# Patient Record
Sex: Male | Born: 1980 | Hispanic: Yes | Marital: Single | Smoking: Heavy tobacco smoker
Health system: Southern US, Community
[De-identification: ages and names within clinical notes are randomized; demographics above are authoritative.]

## PROBLEM LIST (undated history)

## (undated) DIAGNOSIS — B20 Human immunodeficiency virus [HIV] disease: Secondary | ICD-10-CM

## (undated) DIAGNOSIS — R4189 Other symptoms and signs involving cognitive functions and awareness: Secondary | ICD-10-CM

---

## 2016-12-29 ENCOUNTER — Emergency Department
Admission: EM | Admit: 2016-12-29 | Discharge: 2016-12-29 | Disposition: A | Discharge status: Home / Self Care | Payer: Medicare Other | Attending: Emergency Medicine | Admitting: Emergency Medicine

## 2016-12-29 DIAGNOSIS — F17200 Nicotine dependence, unspecified, uncomplicated: Secondary | ICD-10-CM | POA: Diagnosis not present

## 2016-12-29 DIAGNOSIS — Z139 Encounter for screening, unspecified: Secondary | ICD-10-CM

## 2016-12-29 DIAGNOSIS — Z046 Encounter for general psychiatric examination, requested by authority: Secondary | ICD-10-CM | POA: Diagnosis present

## 2016-12-29 LAB — URINE DRUG SCREEN, QUALITATIVE (ARMC ONLY)
Amphetamines, Ur Screen: NOT DETECTED
BARBITURATES, UR SCREEN: NOT DETECTED
Benzodiazepine, Ur Scrn: NOT DETECTED
CANNABINOID 50 NG, UR ~~LOC~~: NOT DETECTED
COCAINE METABOLITE, UR ~~LOC~~: NOT DETECTED
MDMA (ECSTASY) UR SCREEN: NOT DETECTED
Methadone Scn, Ur: NOT DETECTED
OPIATE, UR SCREEN: NOT DETECTED
PHENCYCLIDINE (PCP) UR S: NOT DETECTED
TRICYCLIC, UR SCREEN: NOT DETECTED

## 2016-12-29 LAB — COMPREHENSIVE METABOLIC PANEL
ALK PHOS: 124 U/L (ref 38–126)
ALT: 11 U/L — AB (ref 17–63)
AST: 23 U/L (ref 15–41)
Albumin: 4.3 g/dL (ref 3.5–5.0)
Anion gap: 9 (ref 5–15)
BUN: 21 mg/dL — AB (ref 6–20)
CALCIUM: 9.6 mg/dL (ref 8.9–10.3)
CHLORIDE: 105 mmol/L (ref 101–111)
CO2: 27 mmol/L (ref 22–32)
CREATININE: 1.24 mg/dL (ref 0.61–1.24)
GFR calc non Af Amer: >60 mL/min (ref >60)
GLUCOSE: 80 mg/dL (ref 65–99)
Potassium: 4.1 mmol/L (ref 3.5–5.1)
SODIUM: 141 mmol/L (ref 135–145)
Total Bilirubin: 0.7 mg/dL (ref 0.3–1.2)
Total Protein: 8.5 g/dL — ABNORMAL HIGH (ref 6.5–8.1)

## 2016-12-29 LAB — CBC
HEMATOCRIT: 43.8 % (ref 40.0–52.0)
HEMOGLOBIN: 15.2 g/dL (ref 13.0–18.0)
MCH: 33.5 pg (ref 26.0–34.0)
MCHC: 34.6 g/dL (ref 32.0–36.0)
MCV: 96.8 fL (ref 80.0–100.0)
Platelets: 319 10*3/uL (ref 150–440)
RBC: 4.53 MIL/uL (ref 4.40–5.90)
RDW: 14.9 % — ABNORMAL HIGH (ref 11.5–14.5)
WBC: 8.1 10*3/uL (ref 3.8–10.6)

## 2016-12-29 LAB — ETHANOL: Alcohol, Ethyl (B): <10 mg/dL (ref <10)

## 2016-12-29 LAB — SALICYLATE LEVEL: Salicylate Lvl: <7 mg/dL (ref 2.8–30.0)

## 2016-12-29 LAB — ACETAMINOPHEN LEVEL: Acetaminophen (Tylenol), Serum: <10 ug/mL — ABNORMAL LOW (ref 10–30)

## 2016-12-29 NOTE — ED Notes (Addendum)
First nurse note: pt [place] by [place] [employer] department. Pt is currently voluntary. Per sheriff pt was found on [address]. Pt reports that he lives in a group home but does not know that name of his group home. Pt has been calm and cooperative.

## 2016-12-29 NOTE — ED Notes (Signed)
This RN called Group home at [phone] and requested them to come pick up the patient. Group home person Ms. Ray had told the CSW around 4 pm this afternoon she would be here in 2 hours and she has not picked up the patient at this time. Staff person states she will find out what is going on and call me back.

## 2016-12-29 NOTE — ED Notes (Signed)
This RN spoke with Brock BadLuwanda Ray from [facility] who requested pt be returned to the facility by taxi that they would pay for. This was confirmed by Rolinda RoanKendall M. RN also. Pt will be transported via taxi to [address] [place], [id number]. I informed Ms. Ray of the patients discharge instructions and that I would send his paperwork with the taxi driver. Ms Rosalia HammersRay was agreeable with all of this.

## 2016-12-29 NOTE — ED Notes (Signed)

## 2016-12-29 NOTE — ED Notes (Signed)
Adult protective services worker [name] ph [phone] per state trooper.

## 2016-12-29 NOTE — Progress Notes (Signed)
LCSW received consult to engage with patient to collect information by EDP.   LCSW completed breif assessment and called APS on call worker [name] and together follow up calls will be made to community resources to support the patient. Patient was able to recite his SSN [id number] info provided to DSS APS worker and we will split the calls to obtain further information  Patient according to [name] at the Treatment Center reports patient was to go to a facility in [place]. LCSW will call [facility] to see if they have information [phone] unable to reach staff, will try again  Patient reports he was in  [employer] [place] and reports he did graduate ( not sure) Patient reports he has a guardian names [name]- but has no last name or address or phone number. He reports he was at [place] treatment facility [place] and checked out on Friday  [phone]  Assessment completed.

## 2016-12-29 NOTE — ED Notes (Signed)
Pt dressed out in triage into paper scrubs.   Items collected: Black shirt ComptrollerGrey thermal shirt Grey jacket Grey pants Nike shoes Pair  socks

## 2016-12-29 NOTE — Discharge Instructions (Signed)
°  Follow up with your doctor and/or therapist as soon as possible regarding today's ED visit.   Please follow up any other recommendations and clinic appointments provided by the psychiatry team that saw you in the Emergency Department.

## 2016-12-29 NOTE — ED Notes (Signed)
BEHAVIORAL HEALTH ROUNDING  Patient sleeping: No.  Patient alert and oriented: yes  Behavior appropriate: Yes. ; If no, describe:  Nutrition and fluids offered: Yes  Toileting and hygiene offered: Yes  Sitter present: not applicable, Q 15 min safety rounds and observation.  Law enforcement present: Yes ODS  

## 2016-12-29 NOTE — ED Notes (Signed)
Report received - seen by sw Claudine. Pt sleeping in room

## 2016-12-29 NOTE — ED Notes (Signed)
Pt given lunch tray.

## 2016-12-29 NOTE — Clinical Social Work Note (Signed)
Clinical Social Work Assessment  Patient Details  Name: Brandon Berger MRN: [id number] Date of Birth: Jan 22, 1981  Date of referral:  12/29/16               Reason for consult:  Facility Placement, Guardianship Needs                Permission sought to share information with:  Facility Medical sales representativeContact Representative, Family Supports Permission granted to share information::  Yes, Verbal Permission Granted  Name::        Agency::  [facility] [place] [phone]  Relationship::     Contact Information:     Housing/Transportation Living arrangements for the past 2 months:  No permanent address Source of Information:  Patient, Other (Comment Required), Law Enforcement(Brandon Berger -APS [place] worker [phone] after hours.) Patient Interpreter Needed:    Criminal Activity/Legal Involvement Pertinent to Current Situation/Hospitalization:    Significant Relationships:  None Lives with:  Other (Comment)(TBD) Do you feel safe going back to the place where you live?    Need for family participation in patient care:  Yes (Comment)  Care giving concerns:  TBD   Social Worker assessment / plan:  LCSW received consult to engage with patient to collect information by EDP.   Introduced self to patient and he stated I could call any one I wanted. His Moms name is Brandon FavaShirley Berger in [place] and he remembers a guardian called Brandon DroneBrenda but not her last name or number. Patient has a mild speech impediment or he has marbled effect when speaking. Patient oriented x2- short attention span.   LCSW completed breif assessment and called APS on call worker Brandon Derrythan and together follow up calls will be made to community resources to support the patient. Patient was able to recite his SSN [id number] info provided to DSS APS worker and we will split the calls to obtain further information  Patient according to Brandon RalphsVivian at the Treatment Center reports patient was to go to a facility in [place]. LCSW will  call [facility] to see if they have information [phone] unable to reach staff, will try again  Patient reports he was in  [employer]ial [employer] [place] and reports he did graduate ( not sure) Patient reports he has a guardian names Brandon DroneBrenda- but has no last name or address or phone number. He reports he was [place] and checked out on Friday  [phone]  Assessment completed. LCSW and APS worker will work to find his group home/facility             Employment status:  Disabled (Comment on whether or not currently receiving Disability) Insurance information:  [place]:    Information / Referral to community resources:  APS (Comment Required: [place], Name & Number of worker spoken with)([name] )  Patient/Family's Response to care: TBD  Patient/Family's Understanding of and Emotional Response to Diagnosis, Current Treatment, and Prognosis:   He understands he is in hospital and is lost right now  Emotional Assessment Appearance:  Appears stated age Attitude/Demeanor/Rapport:  Inconsistent Affect (typically observed):  Calm, Adaptable Orientation:  Oriented to Self Alcohol / Substance use:  Not Applicable Psych involvement (Current and /or in the community):  Yes (Comment)  Discharge Needs  Concerns to be addressed:  Basic Needs Readmission within the last 30 days:  No Current discharge risk:  Cognitively Impaired Barriers to Discharge:  Barriers Resolved   Brandon Berger  M, LCSW 12/29/2016, 2:49 PM

## 2016-12-29 NOTE — ED Notes (Signed)
[place] [employer] taxi call for transport. Will be here in approx 25 mins.

## 2016-12-29 NOTE — ED Notes (Signed)
MD at bedside. 

## 2016-12-29 NOTE — ED Triage Notes (Signed)
Transported here by [employer] trooper Anselmo RodJ Sharpe [phone]. Pt has legal guardian per pt report. Unknown number/name. Pt reports left group home this am. Recent surgery per pt due to ruptured appendix. Pt presents via officer for suspicious activity, Unable to locate group home or guardian at this time. Pt A&O and ambulatory.

## 2016-12-29 NOTE — ED Provider Notes (Signed)
[facility] Emergency Department Provider Note   ____________________________________________   First MD Initiated Contact with Patient 12/29/16 1301     (approximate)  I have reviewed the triage vital signs and the nursing notes.   HISTORY  Chief Complaint Psychiatric Evaluation    HPI Brandon Berger is a 36 y.o. male brought for evaluation as he was found walking in the road and did not have an idea as to where he is to go.  Patient was evaluated by law enforcement who have begin a search for patients contacts, but the patient reports that he had surgery on his appendix which had ruptured and then he was admitted to a rehabilitation and he left there after several days.  He knows that his mom lives in [place], he gives an address for what he believes was a group home where he used to live, but then walked out from rehabilitation and scissors states he started walking and was picked out but here after having [facility].  Reports a guardian named '[name]' but doesn't know how to contact her.  The history is somewhat unclear, but he reports being in no pain.  Reports he is hungry.  Denies abdominal pain.  Reports that he is on medications which he takes regularly.  States sugars evidently contacted [facility] [employer], currently searching for his care provider or family  History reviewed. No pertinent past medical history.  There are no active problems to display for this patient.   History reviewed. No pertinent surgical history.  Prior to Admission medications   Not on File    Allergies Patient has no known allergies.  History reviewed. No pertinent family history.  Social History Social History   Tobacco Use  . Smoking status: Heavy Tobacco Smoker  . Smokeless tobacco: Never Used  Substance Use Topics  . Alcohol use: No    Frequency: Never  . Drug use: Yes    Types: Marijuana    Review of  Systems Constitutional: No fever/chills ENT: No sore throat. Cardiovascular: Denies chest pain. Respiratory: Denies shortness of breath. Gastrointestinal: No abdominal pain.  No nausea, no vomiting.  No diarrhea.  No constipation. Musculoskeletal: Negative for back pain. Skin: Negative for rash. Neurological: Negative for headaches, focal weakness or numbness.    ____________________________________________   PHYSICAL EXAM:  VITAL SIGNS: ED Triage Vitals  Enc Vitals Group     BP 12/29/16 1222 115/70     Pulse --      Resp 12/29/16 1219 (P) 14     Temp 12/29/16 1222 98 F (36.7 C)     Temp Source 12/29/16 1222 Oral     SpO2 12/29/16 1219 (P) 97 %     Weight 12/29/16 1223 150 lb (68 kg)     Height 12/29/16 1223 5\' 8"  (1.727 m)     Head Circumference --      Peak Flow --      Pain Score --      Pain Loc --      Pain Edu? --      Excl. in GC? --     Constitutional: Alert and oriented. Well appearing and in no acute distress. Eyes: Conjunctivae are normal. Head: Atraumatic. Nose: No congestion/rhinnorhea. Mouth/Throat: Mucous membranes are moist. Neck: No stridor.   Cardiovascular: Normal rate, regular rhythm. Grossly normal heart sounds.  Good peripheral circulation. Respiratory: Normal respiratory effort.  No retractions. Lungs CTAB. Gastrointestinal: Soft and nontender. No distention. Musculoskeletal:  No lower extremity tenderness nor edema. Neurologic:  Normal speech and language. No gross focal neurologic deficits are appreciated.  Skin:  Skin is warm, dry and intact. No rash noted. Psychiatric: Mood and affect are normal. Speech and behavior are normal.  ____________________________________________   LABS (all labs ordered are listed, but only abnormal results are displayed)  Labs Reviewed  COMPREHENSIVE METABOLIC PANEL - Abnormal; Notable for the following components:      Result Value   BUN 21 (*)    Total Protein 8.5 (*)    ALT 11 (*)    All other  components within normal limits  ACETAMINOPHEN LEVEL - Abnormal; Notable for the following components:   Acetaminophen (Tylenol), Serum <10 (*)    All other components within normal limits  CBC - Abnormal; Notable for the following components:   RDW 14.9 (*)    All other components within normal limits  ETHANOL  SALICYLATE LEVEL  URINE DRUG SCREEN, QUALITATIVE ([facility] ONLY)   ____________________________________________  EKG   ____________________________________________  RADIOLOGY    ____________________________________________   PROCEDURES  Procedure(s) performed: None  Procedures  Critical Care performed: No  ____________________________________________   INITIAL IMPRESSION / ASSESSMENT AND PLAN / ED COURSE  Pertinent labs & imaging results that were available during my care of the patient were reviewed by me and considered in my medical decision making (see chart for details).  ----------------------------------------- 4:20 PM on 12/29/2016 -----------------------------------------  Patient's group home has been located.  He will be coming to pick him up.  They report that they were waiting for him to return from a walk.  He is in no distress, and will return to his caretakers at his group home once they arrived to pick him up.      ____________________________________________   FINAL CLINICAL IMPRESSION(S) / ED DIAGNOSES  Final diagnoses:  Encounter for medical screening examination      NEW MEDICATIONS STARTED DURING THIS VISIT:  This SmartLink is deprecated. Use AVSMEDLIST instead to display the medication list for a patient.   Note:  This document was prepared using Dragon voice recognition software and may include unintentional dictation errors.     Sharyn CreamerQuale, Talha Iser, MD 12/29/16 1620

## 2016-12-29 NOTE — Progress Notes (Signed)
Called Brandon Berger [phone] and he provided information according to treatment center nurse patient is HIV positive  Brandon Berger  Resides at [facility] [phone] spoke to Brock BadLuwanda Ray  Group home will be by in the next 2 hours to pick up patient  [employer]audine Carmelite Violet LCSW [phone]

## 2016-12-30 NOTE — ED Notes (Signed)
This RN attempted to call patients legal guardian Verl BlalockBrenda Sinner at [phone] to inform her that the patient has been in the ED and is being discharge back to the group home. No answer at this number. Guardian information was given to this RN by Brock BadLuwanda Ray, owner of the group home.

## 2016-12-31 ENCOUNTER — Emergency Department (HOSPITAL_COMMUNITY): Payer: Medicare Other

## 2016-12-31 ENCOUNTER — Encounter (HOSPITAL_COMMUNITY): Payer: Self-pay | Admitting: *Deleted

## 2016-12-31 ENCOUNTER — Emergency Department (HOSPITAL_COMMUNITY)
Admission: EM | Admit: 2016-12-31 | Discharge: 2017-01-01 | Disposition: A | Discharge status: Home / Self Care | Payer: Medicare Other | Attending: Emergency Medicine | Admitting: Emergency Medicine

## 2016-12-31 ENCOUNTER — Other Ambulatory Visit: Payer: Self-pay

## 2016-12-31 DIAGNOSIS — Z79899 Other long term (current) drug therapy: Secondary | ICD-10-CM | POA: Insufficient documentation

## 2016-12-31 DIAGNOSIS — Y999 Unspecified external cause status: Secondary | ICD-10-CM | POA: Diagnosis not present

## 2016-12-31 DIAGNOSIS — F17210 Nicotine dependence, cigarettes, uncomplicated: Secondary | ICD-10-CM | POA: Diagnosis not present

## 2016-12-31 DIAGNOSIS — Y939 Activity, unspecified: Secondary | ICD-10-CM | POA: Diagnosis not present

## 2016-12-31 DIAGNOSIS — Z21 Asymptomatic human immunodeficiency virus [HIV] infection status: Secondary | ICD-10-CM | POA: Insufficient documentation

## 2016-12-31 DIAGNOSIS — W19XXXA Unspecified fall, initial encounter: Secondary | ICD-10-CM | POA: Diagnosis not present

## 2016-12-31 DIAGNOSIS — S20309A Unspecified superficial injuries of unspecified front wall of thorax, initial encounter: Secondary | ICD-10-CM | POA: Diagnosis present

## 2016-12-31 DIAGNOSIS — Y929 Unspecified place or not applicable: Secondary | ICD-10-CM | POA: Diagnosis not present

## 2016-12-31 DIAGNOSIS — S2231XA Fracture of one rib, right side, initial encounter for closed fracture: Secondary | ICD-10-CM | POA: Insufficient documentation

## 2016-12-31 HISTORY — DX: Other symptoms and signs involving cognitive functions and awareness: R41.89

## 2016-12-31 HISTORY — DX: Human immunodeficiency virus (HIV) disease: B20

## 2016-12-31 NOTE — ED Notes (Signed)
Patient transported to X-ray 

## 2016-12-31 NOTE — ED Triage Notes (Signed)
Pt brought in by [facility] for c/o right side abdominal pain and right leg pain

## 2017-01-01 MED ORDER — CYCLOBENZAPRINE HCL 5 MG PO TABS: 5.0000 mg | ORAL_TABLET | Freq: Three times a day (TID) | ORAL | 0 refills | Status: AC | PRN

## 2017-01-01 MED ORDER — NAPROXEN 250 MG PO TABS: ORAL_TABLET | ORAL | 0 refills | Status: AC

## 2017-01-01 NOTE — ED Notes (Signed)
Pt given another coke per request.

## 2017-01-01 NOTE — ED Notes (Signed)
Pt ambulatory w/o difficulties.

## 2017-01-01 NOTE — ED Notes (Signed)
Called facility back and staff report they called for transport but no one answered, message was left. Requested that they call back for transport as pt is still a/w ride home. Staff verbalized they would call again.

## 2017-01-01 NOTE — ED Notes (Signed)
Pt ambulatory to bathroom and back to room 

## 2017-01-01 NOTE — Discharge Instructions (Signed)
Use ice for comfort. Give him the medications as needed for pain. It appears he is already on oxycodone for pain.  He should be rechecked if he gets a fever, cough, struggles to breathe. He should be rechecked by his doctor in about 2 weeks.

## 2017-01-01 NOTE — ED Notes (Signed)
01/01/17- [id number]:  The group home [facility] ([address] [place], [place] [address]) [phone] has been contacted multiple times by ED personell this morning to get a ride for the patient home and no one has arrived yet and the staff at the group home keeps telling the ED staff that someone is coming to get the patient. Adult Protective Services of [name] was contacted and a report was filed for patient abandonment and Lyla SonCarrie at [facility] is to follow up with ED.  0915Sidney Ace: [place] PD contacted bc patient has wandered away from the waiting room toward the front of the hospital.  0920: Onalee HuaDavid [phone] is a Haematologiststaff member of [facility] showed up at this time and was informed patient had left the ED. [phone]: [place] PD located the patient at this time and pt left with Onalee Huaavid (group home staff member).

## 2017-01-01 NOTE — ED Notes (Signed)
Pt get restless and states he is ready to go home. Pt given gingerale and snack and redirected to room.

## 2017-01-01 NOTE — ED Notes (Signed)
ED Provider at bedside. 

## 2017-01-01 NOTE — ED Provider Notes (Addendum)
[facility] EMERGENCY DEPARTMENT Provider Note   CSN: [id number] Arrival date & time: 12/31/16  2246  Time seen 12:18 AM   History   Chief Complaint Chief Complaint  Patient presents with  . Chest Pain    right lower rib pain from fall   Level 5 caveat for mental retardation  HPI Brandon Berger is a 36 y.o. male.  HPI patient speech is very hard to understand, he is mumbling and speaking softly.  He states he fell "yesterday" and that may have been 2 days ago.  He states he was walking up a hill and he fell forward.  He complains of some pain of his right lower rib cage since he fell.  He denies feeling short of breath or cough.  Patient states EMS picked him up from his group home, he does not know the name of where he stays.  He states he is only been there about 3 days.  He states before that he lived 5 months somewhere else in [place].  PCP unknown to the patient  Past Medical History:  Diagnosis Date  . Cognitive deficits   . Cognitive impairment   . HIV (human immunodeficiency virus infection) (HCC)     There are no active problems to display for this patient.   History reviewed. No pertinent surgical history.     Home Medications    Prior to Admission medications   Medication Sig Start Date End Date Taking? Authorizing Provider  atomoxetine (STRATTERA) 40 MG capsule Take 40 mg by mouth daily.   Yes [provider]  Darunavir-Cobicisctat-Emtricitabine-Tenofovir Alafenamide (SYMTUZA) 800-150-200-10 MG TABS Take 1 tablet by mouth daily.   Yes [provider]  docusate sodium (COLACE) 100 MG capsule Take 100 mg by mouth at bedtime.   Yes [provider]  ENSURE (ENSURE) Take 237 mLs by mouth 3 (three) times daily between meals.   Yes [provider]  famotidine (PEPCID) 20 MG tablet Take 20 mg by mouth 2 (two) times daily.   Yes [provider]  gabapentin (NEURONTIN) 400 MG capsule Take 400 mg by mouth 3 (three)  times daily.   Yes [provider]  lamoTRIgine (LAMICTAL) 200 MG tablet Take 200 mg by mouth every morning.   Yes [provider]  OLANZapine (ZYPREXA) 10 MG tablet Take 10 mg by mouth at bedtime.   Yes [provider]  OLANZapine (ZYPREXA) 15 MG tablet Take 15 mg by mouth at bedtime.   Yes [provider]  oxyCODONE-acetaminophen (PERCOCET/ROXICET) 5-325 MG tablet Take 1 tablet by mouth every 6 (six) hours as needed for moderate pain or severe pain.   Yes [provider]  cyclobenzaprine (FLEXERIL) 5 MG tablet Take 1 tablet (5 mg total) by mouth 3 (three) times daily as needed. 01/01/17   Devoria Albe, MD  naproxen (NAPROSYN) 250 MG tablet Take 1 po BID with food prn pain 01/01/17   Devoria Albe, MD    Family History History reviewed. No pertinent family history.  Social History Social History   Tobacco Use  . Smoking status: Heavy Tobacco Smoker  . Smokeless tobacco: Never Used  Substance Use Topics  . Alcohol use: No    Frequency: Never  . Drug use: Yes    Types: Marijuana  lives in a group home   Allergies   Patient has no known allergies.   Review of Systems Review of Systems  Unable to perform ROS: Other     Physical Exam Updated  Vital Signs BP 99/70   Pulse 68   Temp 97.6 F (36.4 C) (Oral)   Resp 17   Ht 5\' 8"  (1.727 m)   Wt 68 kg (150 lb)   SpO2 97%   BMI 22.81 kg/m   Physical Exam  Constitutional: He appears well-developed and well-nourished. He does not appear ill.  Pt mumbles and speaks softly  HENT:  Head: Normocephalic and atraumatic.  Missing several teeth  Eyes: EOM are normal. Pupils are equal, round, and reactive to light.  Neck: Normal range of motion. Neck supple.  Cardiovascular: Normal rate and regular rhythm. Exam reveals no S3 and no S4.  No murmur heard. Pulmonary/Chest: Effort normal. He has decreased breath sounds.  Musculoskeletal: Normal range of motion.       Right lower leg: Normal.        Left lower leg: Normal.  Neurological: He is alert.  Skin: Skin is warm and dry.  Has an area on his right flank with some hyperpigmentation that he states is from shingles in the past  Psychiatric: He has a normal mood and affect. His behavior is normal.  Nursing note and vitals reviewed.    ED Treatments / Results  Labs (all labs ordered are listed, but only abnormal results are displayed) Labs Reviewed - No data to display  BMET    Component Value Date/Time   NA 141 12/29/2016 1228   K 4.1 12/29/2016 1228   CL 105 12/29/2016 1228   CO2 27 12/29/2016 1228   GLUCOSE 80 12/29/2016 1228   BUN 21 (H) 12/29/2016 1228   CREATININE 1.24 12/29/2016 1228   CALCIUM 9.6 12/29/2016 1228   GFRNONAA >60 12/29/2016 1228   GFRAA >60 12/29/2016 1228      EKG  EKG Interpretation None       Radiology Dg Ribs Unilateral W/chest Right  Result Date: 01/01/2017 CLINICAL DATA:  Right rib pain. EXAM: RIGHT RIBS AND CHEST - 3+ VIEW COMPARISON:  None. FINDINGS: Minimally displaced fracture of the right lateral eighth rib. There is no evidence of pneumothorax or pleural effusion. Both lungs are clear. Heart size and mediastinal contours are within normal limits. IMPRESSION: Minimally displaced fracture of the right lateral eighth rib. Electronically Signed   By: Ted Mcalpineobrinka  Dimitrova M.D.   On: 01/01/2017 01:19    Procedures Procedures (including critical care time)  Medications Ordered in ED Medications - No data to display   Initial Impression / Assessment and Plan / ED Course  I have reviewed the triage vital signs and the nursing notes.  Pertinent labs & imaging results that were available during my care of the patient were reviewed by me and considered in my medical decision making (see chart for details).     X-ray was done to look for rib fracture.  Nurses note patient was ambulatory from the ambulance into the ED without assistance.  When I asked the patient he denies  having any leg pain.  Patient was awakened at time of discharge and told he had a rib fracture.  Nursing staff has contacted his group home to send him back.  Patient already has oxycodone on his medication list.  He was discharged home with naproxen and Flexeril.  His BUN and creatinine were noted from 2 days ago.  Review of the [place] database shows patient has had lorazepam 0.5 mg tablets written for 7 up to 42 tablets several times from April to May 31.  He got #30 oxycodone 5/325 on  July 20, #120 oxycodone 5/325 on August 20, and #40 oxycodone 5/325 on August 20.  It is not clear if these are this patient because they are from physicians in [place], [place], [place], and [place].   Final Clinical Impressions(s) / ED Diagnoses   Final diagnoses:  Closed fracture of one rib of right side, initial encounter    ED Discharge Orders        Ordered    naproxen (NAPROSYN) 250 MG tablet     01/01/17 0316    cyclobenzaprine (FLEXERIL) 5 MG tablet  3 times daily PRN     01/01/17 0316      Plan discharge  Devoria AlbeIva Averi Kilty, MD, Concha PyoFACEP    Laderius Valbuena, MD 01/01/17 [id number]    Devoria AlbeKnapp, Recardo Linn, MD 01/01/17 [phone]

## 2017-01-01 NOTE — ED Notes (Signed)
This nurse Called back to group home and spoke with Brandon Berger, she reports that transportation is on way and it will either be Brandon Berger or Brandon Berger to pick up pt. Discharge instructions were reviewed with Brandon Berger including Rx and pain management and follow up. She verbalized understanding. Pt given discharge papers with Rx and is waiting in lobby. Informed Brandon Berger that pt would be waiting for ride in lobby.

## 2017-01-01 NOTE — ED Notes (Signed)
Pt given coke to drink 

## 2017-01-01 NOTE — ED Notes (Signed)
[facility] called to inform pt is being discharged- informed staff pt will need ride home. Staff reports they will send transportation to [facility].

## 2017-01-01 NOTE — ED Notes (Addendum)
Called facility for the third time, spoke with Georgeanna HarrisonSearra, who reports the transport staff usually do not answer or return calls until 6 am. Requested that she call me back when she got in touch with transport to confirm. Sea verbalized she would call me back.

## 2017-01-26 ENCOUNTER — Emergency Department (HOSPITAL_COMMUNITY)
Admission: EM | Admit: 2017-01-26 | Discharge: 2017-01-26 | Disposition: A | Discharge status: Home / Self Care | Payer: Medicare Other | Attending: Emergency Medicine | Admitting: Emergency Medicine

## 2017-01-26 ENCOUNTER — Emergency Department (HOSPITAL_COMMUNITY): Payer: Medicare Other

## 2017-01-26 ENCOUNTER — Encounter (HOSPITAL_COMMUNITY): Payer: Self-pay | Admitting: Emergency Medicine

## 2017-01-26 DIAGNOSIS — S2231XD Fracture of one rib, right side, subsequent encounter for fracture with routine healing: Secondary | ICD-10-CM | POA: Diagnosis not present

## 2017-01-26 DIAGNOSIS — R0789 Other chest pain: Secondary | ICD-10-CM | POA: Diagnosis present

## 2017-01-26 DIAGNOSIS — F17200 Nicotine dependence, unspecified, uncomplicated: Secondary | ICD-10-CM | POA: Insufficient documentation

## 2017-01-26 DIAGNOSIS — Z79899 Other long term (current) drug therapy: Secondary | ICD-10-CM | POA: Insufficient documentation

## 2017-01-26 DIAGNOSIS — X58XXXD Exposure to other specified factors, subsequent encounter: Secondary | ICD-10-CM | POA: Diagnosis not present

## 2017-01-26 DIAGNOSIS — S2231XG Fracture of one rib, right side, subsequent encounter for fracture with delayed healing: Secondary | ICD-10-CM

## 2017-01-26 NOTE — ED Notes (Signed)
Pt has been resting quietly since arrival  He is difficult to understand  Call to care home POC: Alger MemosLinda Berger who advises that pt needs to be returned by EMS  She asks' has he tried to run away on you?"

## 2017-01-26 NOTE — ED Notes (Signed)
[facility] [phone] To advise that pt needs to be returned to care home by stretcher
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
## 2017-01-26 NOTE — ED Notes (Signed)
Sleeping NAD

## 2017-01-26 NOTE — ED Notes (Signed)
Meal provided  Pt assisted to BR  Pt has an altered gait and is DDelayed  Difficult to redirect

## 2017-01-26 NOTE — ED Provider Notes (Signed)
[facility]Y [facility]PART[facility] Provider Note   CSN: [id number] Arrival date & time: 01/26/17  1043     History   Chief Complaint Chief Complaint  Patient presents with  . Chest Pain    HPI Brandon Berger is a 36 y.o. male.  Level 5 caveat for cognitive impairment.  Patient complains of right lateral rib pain.  He has a recent known fracture of his right eighth rib.  No substernal chest pain, dyspnea, fever, sweats, chills.  Severity of pain is mild to moderate.  Palpation makes pain worse.      Past Medical History:  Diagnosis Date  . Cognitive deficits   . Cognitive impairment   . HIV (human immunodeficiency virus infection) (HCC)     There are no active problems to display for this patient.   History reviewed. No pertinent surgical history.     Home Medications    Prior to Admission medications   Medication Sig Start Date End Date Taking? Authorizing Provider  atomoxetine (STRATTERA) 40 MG capsule Take 40 mg by mouth daily.    [provider]  cyclobenzaprine (FLEXERIL) 5 MG tablet Take 1 tablet (5 mg total) by mouth 3 (three) times daily as needed. 01/01/17   Devoria AlbeKnapp, Iva, MD  Darunavir-Cobicisctat-Emtricitabine-Tenofovir Alafenamide (SYMTUZA) 800-150-200-10 MG TABS Take 1 tablet by mouth daily.    [provider]  docusate sodium (COLACE) 100 MG capsule Take 100 mg by mouth at bedtime.    [provider]  ENSURE (ENSURE) Take 237 mLs by mouth 3 (three) times daily between meals.    [provider]  famotidine (PEPCID) 20 MG tablet Take 20 mg by mouth 2 (two) times daily.    [provider]  gabapentin (NEURONTIN) 400 MG capsule Take 400 mg by mouth 3 (three) times daily.    [provider]  lamoTRIgine (LAMICTAL) 200 MG tablet Take 200 mg by mouth every morning.    [provider]  naproxen (NAPROSYN) 250 MG tablet Take 1 po BID with food prn pain 01/01/17   Devoria AlbeKnapp, Iva, MD  OLANZapine (ZYPREXA) 10  MG tablet Take 10 mg by mouth at bedtime.    [provider]  OLANZapine (ZYPREXA) 15 MG tablet Take 15 mg by mouth at bedtime.    [provider]  oxyCODONE-acetaminophen (PERCOCET/ROXICET) 5-325 MG tablet Take 1 tablet by mouth every 6 (six) hours as needed for moderate pain or severe pain.    [provider]    Family History History reviewed. No pertinent family history.  Social History Social History   Tobacco Use  . Smoking status: Heavy Tobacco Smoker  . Smokeless tobacco: Never Used  Substance Use Topics  . Alcohol use: No    Frequency: Never  . Drug use: Yes    Types: Marijuana     Allergies   Patient has no known allergies.   Review of Systems Review of Systems  Unable to perform ROS: Psychiatric disorder     Physical Exam Updated Vital Signs BP 117/78 (BP Location: Right Arm)   Pulse (!) 57   Temp 97.7 F (36.5 C) (Oral)   Resp 16   Ht 5\' 8"  (1.727 m)   Wt 68 kg (150 lb)   SpO2 98%   BMI 22.81 kg/m   Physical Exam  Constitutional: He appears well-developed and well-nourished.  HENT:  Head: Normocephalic and atraumatic.  Eyes: Conjunctivae are normal.  Neck: Neck supple.  Cardiovascular: Normal rate and regular rhythm.  Pulmonary/Chest: Effort  normal.  Tender to palpation right inferior lateral rib cage  Abdominal: Soft. Bowel sounds are normal.  Musculoskeletal: Normal range of motion.  Neurological: He is alert.  Skin: Skin is warm and dry.  Psychiatric: He has a normal mood and affect. His behavior is normal.  Flat affect  Nursing note and vitals reviewed.    ED Treatments / Results  Labs (all labs ordered are listed, but only abnormal results are displayed) Labs Reviewed - No data to display  EKG  EKG Interpretation None       Radiology Dg Chest 2 View  Result Date: 01/26/2017 CLINICAL DATA:  Evaluate for rib fracture.  Right rib pain. EXAM: CHEST  2 VIEW COMPARISON:  None. FINDINGS: The heart  size and mediastinal contours are within normal limits. Both lungs are clear. Chronic appearing right rib fracture deformity is again noted. No displaced rib fractures identified. IMPRESSION: 1. Stable appearance of right rib fracture deformity. Electronically Signed   By: Signa Kellaylor  Stroud M.D.   On: 01/26/2017 11:19    Procedures Procedures (including critical care time)  Medications Ordered in ED Medications - No data to display   Initial Impression / Assessment and Plan / ED Course  I have reviewed the triage vital signs and the nursing notes.  Pertinent labs & imaging results that were available during my care of the patient were reviewed by me and considered in my medical decision making (see chart for details).     Patient is in no acute distress.  Chest x-ray reveals a stable appearance of a right 8th rib fracture  Final Clinical Impressions(s) / ED Diagnoses   Final diagnoses:  Closed fracture of one rib of right side with delayed healing, subsequent encounter    ED Discharge Orders    None       Donnetta Hutchingook, Tamyra Fojtik, MD 01/26/17 1303

## 2017-01-26 NOTE — ED Notes (Signed)
From Xray 

## 2017-01-26 NOTE — Discharge Instructions (Signed)
X-ray shows a persistent right eighth rib fracture.  There is no specific cure for this.  Tylenol or ibuprofen for pain.

## 2017-01-26 NOTE — ED Notes (Signed)
Awaiting transport.

## 2017-01-26 NOTE — ED Notes (Signed)
[name] has called and reports that they cannot return pt  [facility] called and they will send a truck to return pt home

## 2017-01-27 ENCOUNTER — Encounter (HOSPITAL_COMMUNITY): Payer: Self-pay

## 2017-01-27 ENCOUNTER — Emergency Department (HOSPITAL_COMMUNITY)
Admission: EM | Admit: 2017-01-27 | Discharge: 2017-01-27 | Disposition: A | Discharge status: Home / Self Care | Payer: Medicare Other | Attending: Emergency Medicine | Admitting: Emergency Medicine

## 2017-01-27 ENCOUNTER — Emergency Department (HOSPITAL_COMMUNITY): Payer: Medicare Other

## 2017-01-27 DIAGNOSIS — Z79899 Other long term (current) drug therapy: Secondary | ICD-10-CM | POA: Diagnosis not present

## 2017-01-27 DIAGNOSIS — B20 Human immunodeficiency virus [HIV] disease: Secondary | ICD-10-CM | POA: Insufficient documentation

## 2017-01-27 DIAGNOSIS — Y998 Other external cause status: Secondary | ICD-10-CM | POA: Diagnosis not present

## 2017-01-27 DIAGNOSIS — S022XXA Fracture of nasal bones, initial encounter for closed fracture: Secondary | ICD-10-CM | POA: Insufficient documentation

## 2017-01-27 DIAGNOSIS — R040 Epistaxis: Secondary | ICD-10-CM | POA: Insufficient documentation

## 2017-01-27 DIAGNOSIS — F17290 Nicotine dependence, other tobacco product, uncomplicated: Secondary | ICD-10-CM | POA: Diagnosis not present

## 2017-01-27 DIAGNOSIS — Y9289 Other specified places as the place of occurrence of the external cause: Secondary | ICD-10-CM | POA: Diagnosis not present

## 2017-01-27 DIAGNOSIS — Y9389 Activity, other specified: Secondary | ICD-10-CM | POA: Insufficient documentation

## 2017-01-27 DIAGNOSIS — S0992XA Unspecified injury of nose, initial encounter: Secondary | ICD-10-CM | POA: Diagnosis present

## 2017-01-27 DIAGNOSIS — S02401A Maxillary fracture, unspecified, initial encounter for closed fracture: Secondary | ICD-10-CM

## 2017-01-27 DIAGNOSIS — R600 Localized edema: Secondary | ICD-10-CM | POA: Insufficient documentation

## 2017-01-27 MED ORDER — OXYMETAZOLINE HCL 0.05 % NA SOLN
1.0000 | Freq: Once | NASAL | Status: AC
  Administered 2017-01-27: 1 via NASAL
  Filled 2017-01-27: qty 15

## 2017-01-27 MED ORDER — GABAPENTIN 400 MG PO CAPS
400.0000 mg | ORAL_CAPSULE | Freq: Once | ORAL | Status: AC
  Administered 2017-01-27: 400 mg via ORAL
  Filled 2017-01-27: qty 1

## 2017-01-27 MED ORDER — SILVER NITRATE-POT NITRATE 75-25 % EX MISC
CUTANEOUS | Status: AC
  Filled 2017-01-27: qty 1

## 2017-01-27 MED ORDER — SILVER NITRATE-POT NITRATE 75-25 % EX MISC: 1.0000 | Freq: Once | CUTANEOUS | Status: DC

## 2017-01-27 MED ORDER — IBUPROFEN 800 MG PO TABS
800.0000 mg | ORAL_TABLET | Freq: Once | ORAL | Status: AC
  Administered 2017-01-27: 800 mg via ORAL
  Filled 2017-01-27: qty 1

## 2017-01-27 MED ORDER — IBUPROFEN 600 MG PO TABS: 600.0000 mg | ORAL_TABLET | Freq: Four times a day (QID) | ORAL | 0 refills | Status: AC | PRN

## 2017-01-27 NOTE — ED Notes (Signed)
Pt has contiually walked around for the last hour. Advised pt multiple times to please stay seated due to his unsteadiness. Pt has been given 3 cokes and 2 decaf coffees per his request.pt sitting in chair resting now.

## 2017-01-27 NOTE — ED Notes (Signed)
[facility] to [facility]. Charge RN aware. Tried number on website and is disconnected. Pt does not know number either. Pt has been resting. nad
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
## 2017-01-27 NOTE — ED Notes (Signed)
Called [facility] to ask about eta, requested by pt.
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
## 2018-12-08 IMAGING — DX DG RIBS W/ CHEST 3+V*R*
5 series · 5 of 5 positions shown · non-contrast
Comparison: None.

CLINICAL DATA: Right rib pain.

EXAM:
RIGHT RIBS AND CHEST - 3+ VIEW

[chest pa]
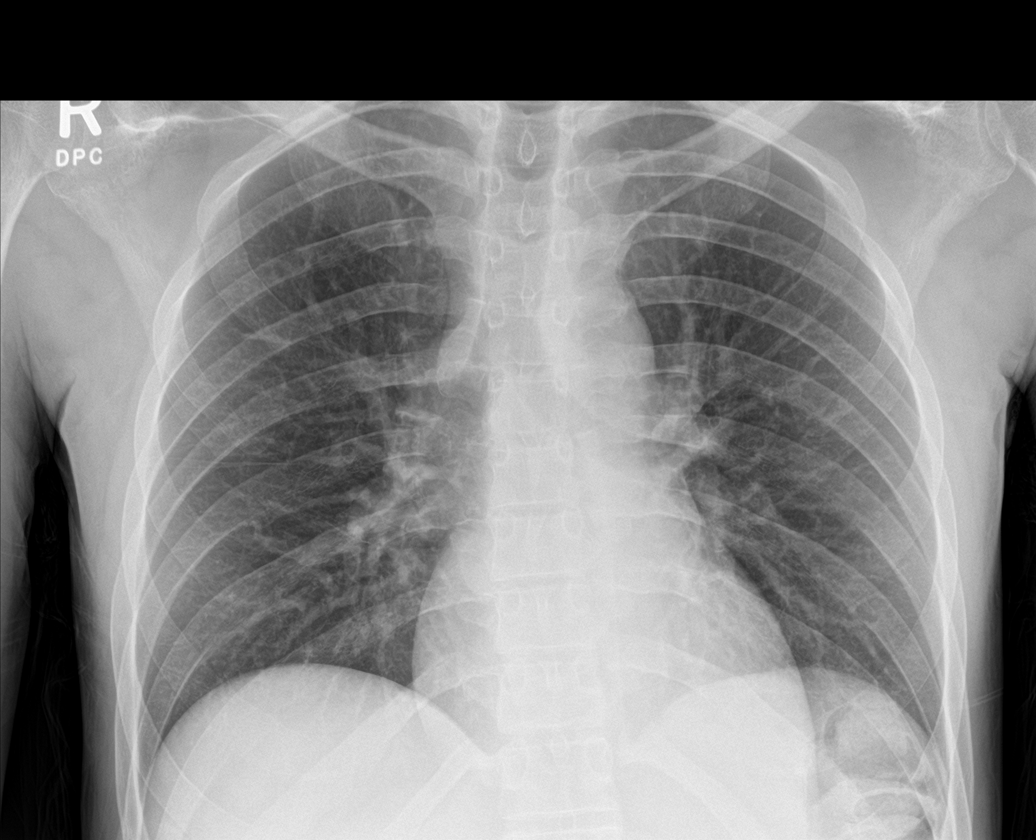

[rib pa (1 of 2)]
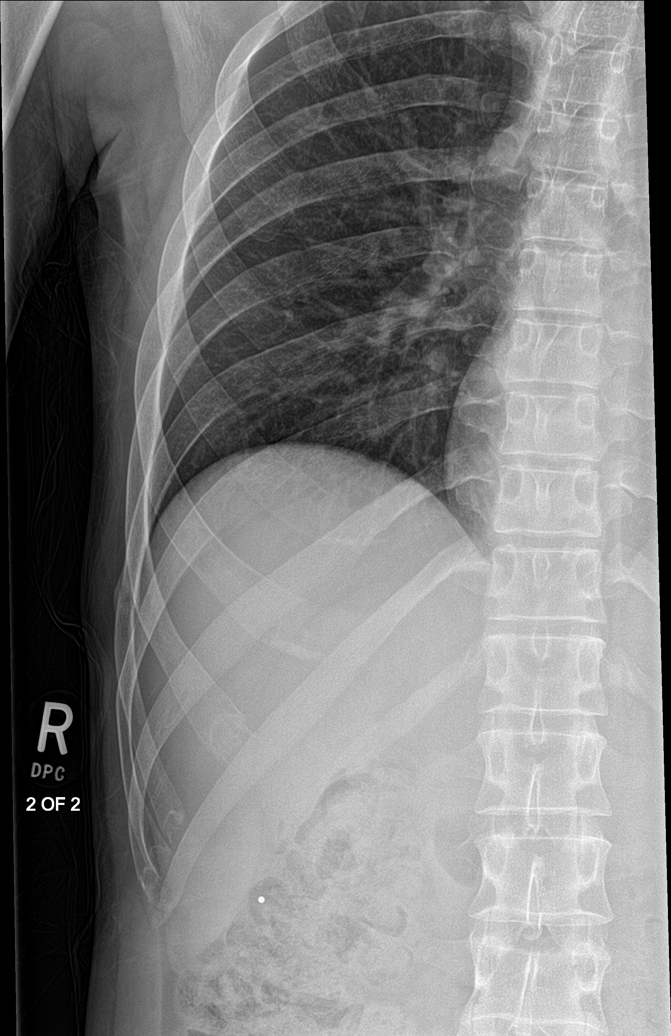

[rib pa obl (1 of 2)]
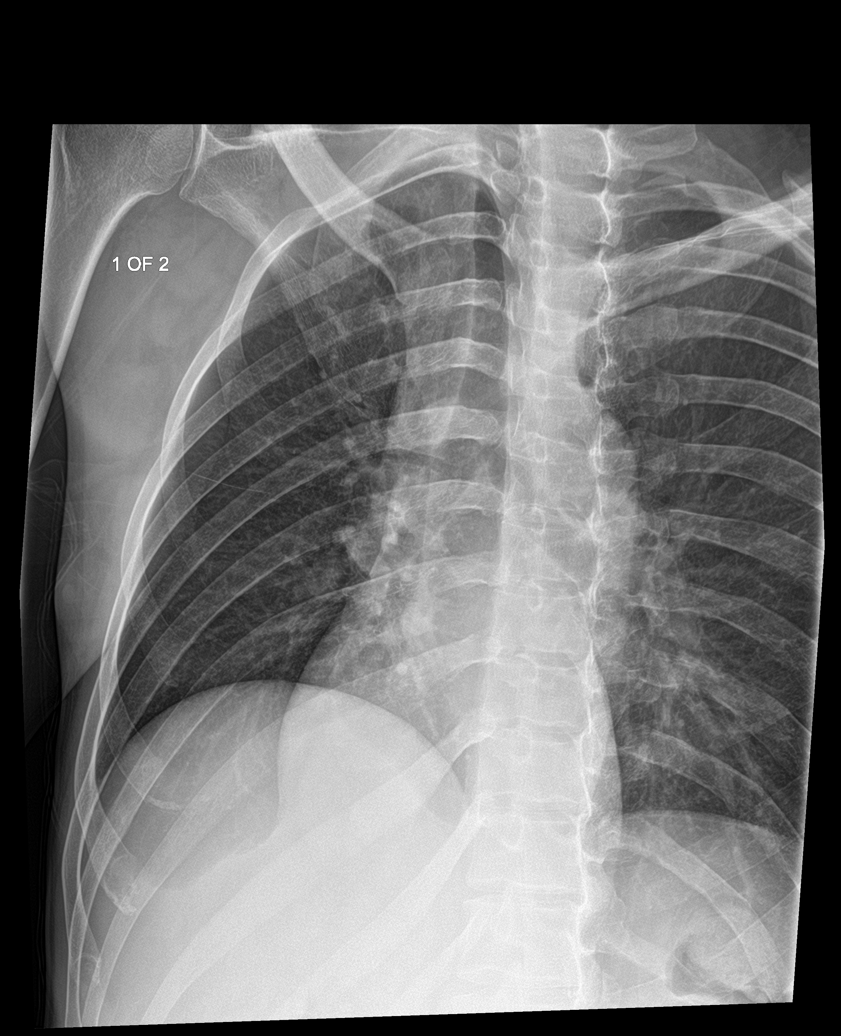

[rib pa obl (2 of 2)]
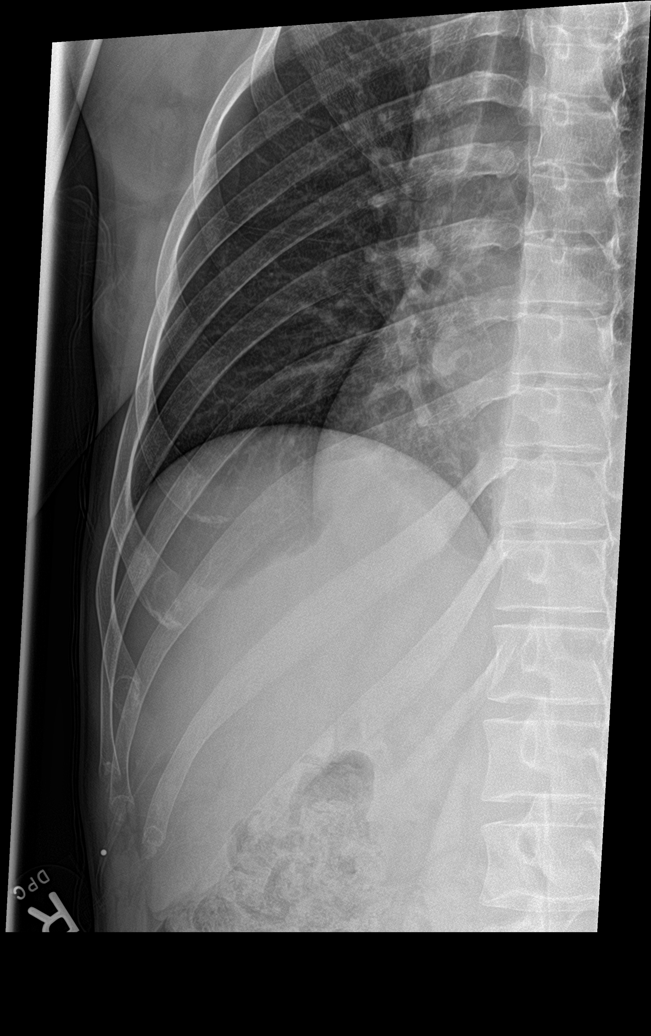

[rib pa (2 of 2)]
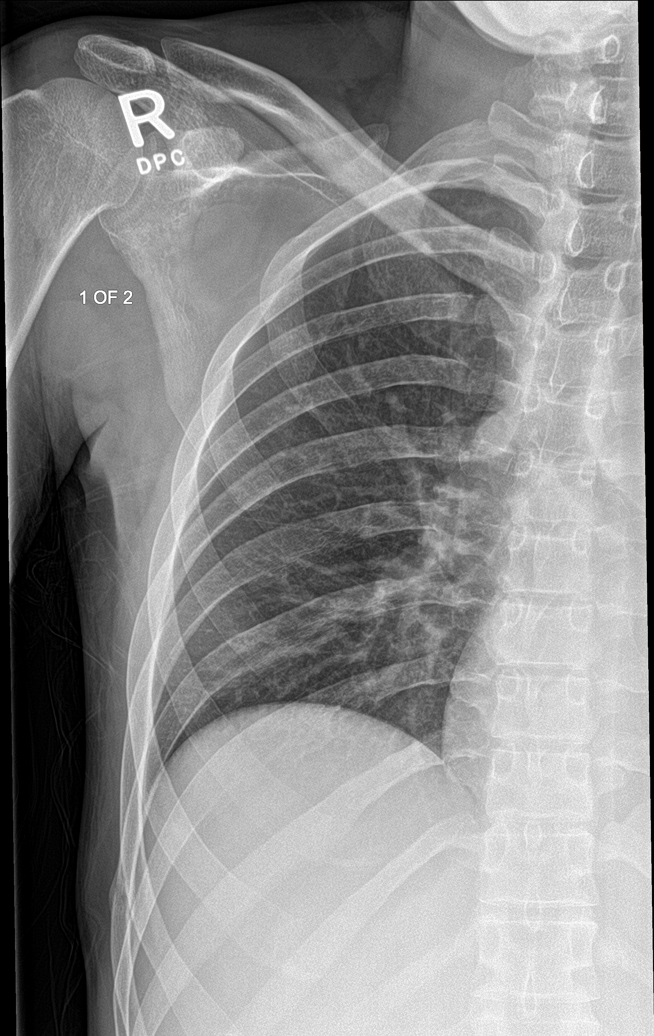

[5 of 5 positions shown; findings below may reference images not displayed]

FINDINGS: Minimally displaced fracture of the right lateral eighth rib. There
is no evidence of pneumothorax or pleural effusion. Both lungs are
clear. Heart size and mediastinal contours are within normal limits.
IMPRESSION: Minimally displaced fracture of the right lateral eighth rib.
